# Patient Record
Sex: Male | Born: 1995 | Race: White | Hispanic: No | Marital: Single | Smoking: Light tobacco smoker
Health system: Southern US, Community
[De-identification: ages and names within clinical notes are randomized; demographics above are authoritative.]

## PROBLEM LIST (undated history)

## (undated) DIAGNOSIS — J45909 Unspecified asthma, uncomplicated: Secondary | ICD-10-CM

## (undated) HISTORY — PX: NO PAST SURGERIES: SHX2092

---

## 2016-06-09 ENCOUNTER — Encounter: Payer: Self-pay | Admitting: Emergency Medicine

## 2016-06-09 ENCOUNTER — Emergency Department
Admission: EM | Admit: 2016-06-09 | Discharge: 2016-06-09 | Disposition: A | Discharge status: Home / Self Care | Payer: Commercial Managed Care - PPO | Attending: Emergency Medicine | Admitting: Emergency Medicine

## 2016-06-09 ENCOUNTER — Emergency Department: Payer: Commercial Managed Care - PPO

## 2016-06-09 DIAGNOSIS — S6991XA Unspecified injury of right wrist, hand and finger(s), initial encounter: Secondary | ICD-10-CM | POA: Diagnosis present

## 2016-06-09 DIAGNOSIS — S0993XA Unspecified injury of face, initial encounter: Secondary | ICD-10-CM | POA: Insufficient documentation

## 2016-06-09 DIAGNOSIS — W0110XA Fall on same level from slipping, tripping and stumbling with subsequent striking against unspecified object, initial encounter: Secondary | ICD-10-CM | POA: Insufficient documentation

## 2016-06-09 DIAGNOSIS — Y929 Unspecified place or not applicable: Secondary | ICD-10-CM | POA: Insufficient documentation

## 2016-06-09 DIAGNOSIS — S52571A Other intraarticular fracture of lower end of right radius, initial encounter for closed fracture: Secondary | ICD-10-CM | POA: Insufficient documentation

## 2016-06-09 DIAGNOSIS — Y9389 Activity, other specified: Secondary | ICD-10-CM | POA: Insufficient documentation

## 2016-06-09 DIAGNOSIS — S52611G Displaced fracture of right ulna styloid process, subsequent encounter for closed fracture with delayed healing: Secondary | ICD-10-CM

## 2016-06-09 DIAGNOSIS — Y999 Unspecified external cause status: Secondary | ICD-10-CM | POA: Diagnosis not present

## 2016-06-09 DIAGNOSIS — S52501A Unspecified fracture of the lower end of right radius, initial encounter for closed fracture: Secondary | ICD-10-CM

## 2016-06-09 DIAGNOSIS — S52201G Unspecified fracture of shaft of right ulna, subsequent encounter for closed fracture with delayed healing: Secondary | ICD-10-CM | POA: Insufficient documentation

## 2016-06-09 MED ORDER — OXYCODONE-ACETAMINOPHEN 5-325 MG PO TABS
1.0000 | ORAL_TABLET | Freq: Once | ORAL | Status: AC
  Administered 2016-06-09: 1 via ORAL
  Filled 2016-06-09: qty 1

## 2016-06-09 MED ORDER — OXYCODONE-ACETAMINOPHEN 5-325 MG PO TABS: 1.0000 | ORAL_TABLET | ORAL | 0 refills | Status: AC | PRN

## 2016-06-09 NOTE — Discharge Instructions (Signed)
Call Dr. Neomia GlassMenz's office on Monday morning to schedule a follow up appointment. Take your medications only as directed. Call and schedule an appointment with the dentist of your choice. Return to the ER for symptoms that change or worsen if unable to schedule an appointment at the student health clinic, dentist, or orthopedics.

## 2016-06-09 NOTE — ED Notes (Addendum)
Pt presents with wrist and dental pain related to attempted back flip off of a water slide.  Pt landed on his wrist and also hit face.  No LOC reported.  Sts his tooth "moved" but is not missing.  Pain 7/10.  Wrist pain has diminished with ice pack in place.

## 2016-06-09 NOTE — ED Provider Notes (Signed)
[facility] Emergency Department Provider Note ____________________________________________  Time seen: Approximately 5:54 PM  I have reviewed the triage vital signs and the nursing notes.   HISTORY  Chief Complaint Wrist Pain and Dental Pain    HPI Willie Moreno is a 20 y.o. male who presents to the emergency department for evaluation of right wrist pain. He states he did a back flip off of a slide and landed on his right wrist and hit his face causing a tooth to move. He denies neck pain or loss of consciousness. He is right hand dominant. He has not taken anything for pain since the incident.   History reviewed. No pertinent past medical history.  There are no active problems to display for this patient.   History reviewed. No pertinent surgical history.  Prior to Admission medications   Medication Sig Start Date End Date Taking? Authorizing Provider  oxyCODONE-acetaminophen (ROXICET) 5-325 MG tablet Take 1 tablet by mouth every 4 (four) hours as needed for severe pain. 06/09/16   Chinita Pester, FNP    Allergies Review of patient's allergies indicates no known allergies.  No family history on file.  Social History Social History  Substance Use Topics  . Smoking status: Never Smoker  . Smokeless tobacco: Not on file  . Alcohol use Not on file    Review of Systems Constitutional: No recent illness. Cardiovascular: Denies chest pain or palpitations. Respiratory: Denies shortness of breath. Musculoskeletal: Pain in mouth and right wrist. Skin: Negative for rash, wound, lesion. Neurological: Negative for focal weakness or numbness.  ____________________________________________   PHYSICAL EXAM:  VITAL SIGNS: ED Triage Vitals  Enc Vitals Group     BP 06/09/16 1713 136/81     Pulse Rate 06/09/16 1713 86     Resp 06/09/16 1713 18     Temp 06/09/16 1713 97.7 F (36.5 C)     Temp Source 06/09/16 1713 Oral     SpO2 06/09/16 1713 98 %      Weight 06/09/16 1717 155 lb (70.3 kg)     Height 06/09/16 1717 5\' 9"  (1.753 m)     Head Circumference --      Peak Flow --      Pain Score 06/09/16 1717 7     Pain Loc --      Pain Edu? --      Excl. in GC? --     Constitutional: Alert and oriented. Well appearing and in no acute distress. Eyes: Conjunctivae are normal. EOMI. Head: Atraumatic. Mouth: Tooth #24 shifted, but remains inside the gum. Neck: No stridor.  Respiratory: Normal respiratory effort.   Musculoskeletal: Obvious deformity of the right wrist.  Neurologic:  Normal speech and language. No gross focal neurologic deficits are appreciated. Speech is normal. No gait instability. Motor and sensory intact of the right wrist, hand, and fingers.   Skin:  Skin is warm, dry and intact. Atraumatic. Psychiatric: Mood and affect are normal. Speech and behavior are normal.  ____________________________________________   LABS (all labs ordered are listed, but only abnormal results are displayed)  Labs Reviewed - No data to display ____________________________________________  RADIOLOGY Right wrist:  1. Comminuted impacted intra-articular right distal radius fracture  as described.  2. Comminuted mildly displaced right ulnar styloid fracture.    ____________________________________________   PROCEDURES  Procedure(s) performed: SPLINT APPLICATION  Authorized by: Kem Boroughs Consent: Verbal consent obtained. Risks and benefits: risks, benefits and alternatives were discussed Consent given by: patient Splint applied by: ER  Tech Location details: right wrist Splint type: sugartong Supplies used: ocl and ace Post-procedure: The splinted body part was neurovascularly unchanged following the procedure. Patient tolerance: Patient tolerated the procedure well with no immediate complications. __________________________________________   INITIAL IMPRESSION / ASSESSMENT AND PLAN / ED COURSE  Clinical Course     Pertinent labs & imaging results that were available during my care of the patient were reviewed by me and considered in my medical decision making (see chart for details).  Spoke with Dr. Rosita KeaMenz. He reviewed the x-ray and will follow up with the patient in the office. Percocet (#12) prescribed. He is to return to the ER for symptoms that worsen if unable to see Dr. Rosita KeaMenz. He is also to follow up with the dentist of his choice. ____________________________________________   FINAL CLINICAL IMPRESSION(S) / ED DIAGNOSES  Final diagnoses:  Distal radius fracture, right, closed, initial encounter  Closed displaced fracture of ulnar styloid with delayed healing, right  Dental injury, initial encounter       Chinita PesterCari B Braxston Quinter, FNP 06/09/16 2113    Emily FilbertJonathan E Williams, MD 06/09/16 2314

## 2016-06-09 NOTE — ED Triage Notes (Signed)
Fell on slip and slide approx 1 hour ago, pain R wrist.

## 2016-06-14 ENCOUNTER — Encounter: Payer: Self-pay | Admitting: *Deleted

## 2016-06-14 ENCOUNTER — Ambulatory Visit
Admission: RE | Admit: 2016-06-14 | Discharge: 2016-06-14 | Disposition: A | Discharge status: Home / Self Care | Payer: Commercial Managed Care - PPO | Source: Ambulatory Visit | Attending: Orthopedic Surgery | Admitting: Orthopedic Surgery

## 2016-06-14 ENCOUNTER — Encounter
Admission: RE | Disposition: A | Discharge status: Home / Self Care | Payer: Self-pay | Source: Ambulatory Visit | Attending: Orthopedic Surgery

## 2016-06-14 ENCOUNTER — Ambulatory Visit: Payer: Commercial Managed Care - PPO

## 2016-06-14 ENCOUNTER — Ambulatory Visit: Payer: Commercial Managed Care - PPO | Admitting: Anesthesiology

## 2016-06-14 DIAGNOSIS — J45909 Unspecified asthma, uncomplicated: Secondary | ICD-10-CM | POA: Insufficient documentation

## 2016-06-14 DIAGNOSIS — Z79899 Other long term (current) drug therapy: Secondary | ICD-10-CM | POA: Insufficient documentation

## 2016-06-14 DIAGNOSIS — W19XXXA Unspecified fall, initial encounter: Secondary | ICD-10-CM | POA: Diagnosis not present

## 2016-06-14 DIAGNOSIS — G5601 Carpal tunnel syndrome, right upper limb: Secondary | ICD-10-CM | POA: Diagnosis not present

## 2016-06-14 DIAGNOSIS — F17200 Nicotine dependence, unspecified, uncomplicated: Secondary | ICD-10-CM | POA: Insufficient documentation

## 2016-06-14 DIAGNOSIS — Z8249 Family history of ischemic heart disease and other diseases of the circulatory system: Secondary | ICD-10-CM | POA: Diagnosis not present

## 2016-06-14 DIAGNOSIS — S52501A Unspecified fracture of the lower end of right radius, initial encounter for closed fracture: Secondary | ICD-10-CM | POA: Diagnosis not present

## 2016-06-14 DIAGNOSIS — Z8781 Personal history of (healed) traumatic fracture: Secondary | ICD-10-CM

## 2016-06-14 DIAGNOSIS — Y9379 Activity, other specified sports and athletics: Secondary | ICD-10-CM | POA: Diagnosis not present

## 2016-06-14 DIAGNOSIS — Z9889 Other specified postprocedural states: Secondary | ICD-10-CM

## 2016-06-14 HISTORY — PX: OPEN REDUCTION INTERNAL FIXATION (ORIF) DISTAL RADIAL FRACTURE: SHX5989

## 2016-06-14 HISTORY — PX: CARPAL TUNNEL RELEASE: SHX101

## 2016-06-14 HISTORY — DX: Unspecified asthma, uncomplicated: J45.909

## 2016-06-14 LAB — URINE DRUG SCREEN, QUALITATIVE (ARMC ONLY)
AMPHETAMINES, UR SCREEN: NOT DETECTED
Barbiturates, Ur Screen: NOT DETECTED
Benzodiazepine, Ur Scrn: NOT DETECTED
CANNABINOID 50 NG, UR ~~LOC~~: POSITIVE — AB
COCAINE METABOLITE, UR ~~LOC~~: NOT DETECTED
MDMA (ECSTASY) UR SCREEN: NOT DETECTED
Methadone Scn, Ur: NOT DETECTED
OPIATE, UR SCREEN: NOT DETECTED
PHENCYCLIDINE (PCP) UR S: NOT DETECTED
Tricyclic, Ur Screen: NOT DETECTED

## 2016-06-14 SURGERY — OPEN REDUCTION INTERNAL FIXATION (ORIF) DISTAL RADIUS FRACTURE
Anesthesia: General | Site: Wrist | Laterality: Right | Wound class: Clean

## 2016-06-14 MED ORDER — HYDROCODONE-ACETAMINOPHEN 5-325 MG PO TABS: 1.0000 | ORAL_TABLET | Freq: Four times a day (QID) | ORAL | 0 refills | Status: AC | PRN

## 2016-06-14 MED ORDER — FENTANYL CITRATE (PF) 100 MCG/2ML IJ SOLN
25.0000 ug | INTRAMUSCULAR | Status: AC | PRN
  Administered 2016-06-14 (×6): 25 ug via INTRAVENOUS

## 2016-06-14 MED ORDER — ONDANSETRON HCL 4 MG/2ML IJ SOLN
INTRAMUSCULAR | Status: DC | PRN
  Administered 2016-06-14: 4 mg via INTRAVENOUS

## 2016-06-14 MED ORDER — FENTANYL CITRATE (PF) 100 MCG/2ML IJ SOLN
INTRAMUSCULAR | Status: AC
  Administered 2016-06-14: 25 ug via INTRAVENOUS
  Filled 2016-06-14: qty 2

## 2016-06-14 MED ORDER — HYDROMORPHONE HCL 1 MG/ML IJ SOLN
0.2500 mg | INTRAMUSCULAR | Status: DC | PRN
  Administered 2016-06-14: 0.25 mg via INTRAVENOUS

## 2016-06-14 MED ORDER — BUPIVACAINE HCL 0.5 % IJ SOLN
INTRAMUSCULAR | Status: DC | PRN
  Administered 2016-06-14: 10 mL

## 2016-06-14 MED ORDER — ONDANSETRON HCL 4 MG/2ML IJ SOLN: 4.0000 mg | Freq: Once | INTRAMUSCULAR | Status: DC | PRN

## 2016-06-14 MED ORDER — BUPIVACAINE HCL (PF) 0.5 % IJ SOLN
INTRAMUSCULAR | Status: AC
  Filled 2016-06-14: qty 30

## 2016-06-14 MED ORDER — CEFAZOLIN SODIUM-DEXTROSE 2-4 GM/100ML-% IV SOLN
INTRAVENOUS | Status: AC
  Filled 2016-06-14: qty 100

## 2016-06-14 MED ORDER — SODIUM CHLORIDE 0.9 % IV SOLN: INTRAVENOUS | Status: DC

## 2016-06-14 MED ORDER — METOCLOPRAMIDE HCL 5 MG/ML IJ SOLN: 5.0000 mg | Freq: Three times a day (TID) | INTRAMUSCULAR | Status: DC | PRN

## 2016-06-14 MED ORDER — ONDANSETRON HCL 4 MG/2ML IJ SOLN: 4.0000 mg | Freq: Four times a day (QID) | INTRAMUSCULAR | Status: DC | PRN

## 2016-06-14 MED ORDER — ONDANSETRON HCL 4 MG PO TABS: 4.0000 mg | ORAL_TABLET | Freq: Four times a day (QID) | ORAL | Status: DC | PRN

## 2016-06-14 MED ORDER — CEFAZOLIN SODIUM-DEXTROSE 2-4 GM/100ML-% IV SOLN
2.0000 g | Freq: Once | INTRAVENOUS | Status: AC
  Administered 2016-06-14: 2 g via INTRAVENOUS

## 2016-06-14 MED ORDER — DEXAMETHASONE SODIUM PHOSPHATE 10 MG/ML IJ SOLN
INTRAMUSCULAR | Status: DC | PRN
  Administered 2016-06-14: 10 mg via INTRAVENOUS

## 2016-06-14 MED ORDER — HYDROCODONE-ACETAMINOPHEN 5-325 MG PO TABS
ORAL_TABLET | ORAL | Status: AC
  Filled 2016-06-14: qty 2

## 2016-06-14 MED ORDER — LACTATED RINGERS IV SOLN
INTRAVENOUS | Status: DC
  Administered 2016-06-14: 13:00:00 via INTRAVENOUS

## 2016-06-14 MED ORDER — HYDROCODONE-ACETAMINOPHEN 5-325 MG PO TABS
1.0000 | ORAL_TABLET | ORAL | Status: DC | PRN
  Administered 2016-06-14: 2 via ORAL

## 2016-06-14 MED ORDER — PROPOFOL 10 MG/ML IV BOLUS
INTRAVENOUS | Status: DC | PRN
  Administered 2016-06-14: 200 mg via INTRAVENOUS

## 2016-06-14 MED ORDER — METOCLOPRAMIDE HCL 10 MG PO TABS: 5.0000 mg | ORAL_TABLET | Freq: Three times a day (TID) | ORAL | Status: DC | PRN

## 2016-06-14 MED ORDER — NEOMYCIN-POLYMYXIN B GU 40-200000 IR SOLN
Status: DC | PRN
  Administered 2016-06-14: 2 mL

## 2016-06-14 MED ORDER — FENTANYL CITRATE (PF) 100 MCG/2ML IJ SOLN
INTRAMUSCULAR | Status: DC | PRN
  Administered 2016-06-14 (×2): 50 ug via INTRAVENOUS

## 2016-06-14 MED ORDER — HYDROMORPHONE HCL 1 MG/ML IJ SOLN
INTRAMUSCULAR | Status: AC
  Administered 2016-06-14: 0.25 mg via INTRAVENOUS
  Filled 2016-06-14: qty 1

## 2016-06-14 SURGICAL SUPPLY — 49 items
BANDAGE ACE 3X5.8 VEL STRL LF (GAUZE/BANDAGES/DRESSINGS) ×3 IMPLANT
BANDAGE ACE 4X5 VEL STRL LF (GAUZE/BANDAGES/DRESSINGS) ×3 IMPLANT
BIT DRILL 2 FAST STEP (BIT) ×3 IMPLANT
BIT DRILL 2.5X4 QC (BIT) ×3 IMPLANT
BNDG ESMARK 4X12 TAN STRL LF (GAUZE/BANDAGES/DRESSINGS) ×3 IMPLANT
CANISTER SUCT 1200ML W/VALVE (MISCELLANEOUS) ×3 IMPLANT
CHLORAPREP W/TINT 26ML (MISCELLANEOUS) ×3 IMPLANT
CUFF TOURN 18 STER (MISCELLANEOUS) ×3 IMPLANT
DRAPE FLUOR MINI C-ARM 54X84 (DRAPES) ×3 IMPLANT
ELECT CAUTERY NEEDLE 2.0 MIC (NEEDLE) IMPLANT
ELECT CAUTERY NEEDLE TIP 1.0 (MISCELLANEOUS)
ELECT REM PT RETURN 9FT ADLT (ELECTROSURGICAL) ×3
ELECTRODE CAUTERY NEDL TIP 1.0 (MISCELLANEOUS) IMPLANT
ELECTRODE REM PT RTRN 9FT ADLT (ELECTROSURGICAL) ×1 IMPLANT
GAUZE PETRO XEROFOAM 1X8 (MISCELLANEOUS) ×6 IMPLANT
GAUZE SPONGE 4X4 12PLY STRL (GAUZE/BANDAGES/DRESSINGS) ×3 IMPLANT
GLOVE BIOGEL PI IND STRL 9 (GLOVE) ×1 IMPLANT
GLOVE BIOGEL PI INDICATOR 9 (GLOVE) ×2
GLOVE SURG SYN 9.0  PF PI (GLOVE) ×2
GLOVE SURG SYN 9.0 PF PI (GLOVE) ×1 IMPLANT
GOWN SRG 2XL LVL 4 RGLN SLV (GOWNS) ×1 IMPLANT
GOWN STRL NON-REIN 2XL LVL4 (GOWNS) ×2
GOWN STRL REUS W/ TWL LRG LVL3 (GOWN DISPOSABLE) ×1 IMPLANT
GOWN STRL REUS W/TWL 2XL LVL3 (GOWN DISPOSABLE) ×3 IMPLANT
GOWN STRL REUS W/TWL LRG LVL3 (GOWN DISPOSABLE) ×2
K-WIRE 1.6 (WIRE) ×4
K-WIRE FX5X1.6XNS BN SS (WIRE) ×2
KIT RM TURNOVER STRD PROC AR (KITS) ×3 IMPLANT
KWIRE FX5X1.6XNS BN SS (WIRE) ×2 IMPLANT
NEEDLE FILTER BLUNT 18X 1/2SAF (NEEDLE) ×2
NEEDLE FILTER BLUNT 18X1 1/2 (NEEDLE) ×1 IMPLANT
NS IRRIG 500ML POUR BTL (IV SOLUTION) ×3 IMPLANT
PACK EXTREMITY ARMC (MISCELLANEOUS) ×3 IMPLANT
PAD CAST CTTN 4X4 STRL (SOFTGOODS) ×2 IMPLANT
PADDING CAST COTTON 4X4 STRL (SOFTGOODS) ×4
PEG SUBCHONDRAL SMOOTH 2.0X14 (Peg) ×3 IMPLANT
PEG SUBCHONDRAL SMOOTH 2.0X20 (Peg) ×6 IMPLANT
PEG SUBCHONDRAL SMOOTH 2.0X22 (Peg) ×9 IMPLANT
PLATE SHORT 21.6X48.9 NRRW RT (Plate) ×3 IMPLANT
SCREW BN 12X3.5XNS CORT TI (Screw) ×1 IMPLANT
SCREW CORT 3.5X12 (Screw) ×2 IMPLANT
SCREW CORT 3.5X14 LNG (Screw) ×3 IMPLANT
SPLINT CAST 1 STEP 3X12 (MISCELLANEOUS) ×3 IMPLANT
STOCKINETTE STRL 4IN 9604848 (GAUZE/BANDAGES/DRESSINGS) ×3 IMPLANT
SUT ETHILON 4-0 (SUTURE) ×2
SUT ETHILON 4-0 FS2 18XMFL BLK (SUTURE) ×1
SUT VICRYL 3-0 27IN (SUTURE) ×3 IMPLANT
SUTURE ETHLN 4-0 FS2 18XMF BLK (SUTURE) ×1 IMPLANT
SYR 3ML LL SCALE MARK (SYRINGE) ×3 IMPLANT

## 2016-06-14 NOTE — Anesthesia Procedure Notes (Signed)
Procedure Name: LMA Insertion Date/Time: 06/14/2016 1:55 PM Performed by: Willie Moreno, Willie Virgin Pre-anesthesia Checklist: Patient identified, Patient being monitored, Timeout performed, Emergency Drugs available and Suction available Patient Re-evaluated:Patient Re-evaluated prior to inductionOxygen Delivery Method: Circle system utilized Preoxygenation: Pre-oxygenation with 100% oxygen Intubation Type: IV induction Ventilation: Mask ventilation without difficulty LMA: LMA inserted LMA Size: 4.0 Tube type: Oral Number of attempts: 1 Placement Confirmation: positive ETCO2 and breath sounds checked- equal and bilateral Tube secured with: Tape Dental Injury: Teeth and Oropharynx as per pre-operative assessment

## 2016-06-14 NOTE — Discharge Instructions (Addendum)
AMBULATORY SURGERY  DISCHARGE INSTRUCTIONS   1) The drugs that you were given will stay in your system until tomorrow so for the next 24 hours you should not:  A) Drive an automobile B) Make any legal decisions C) Drink any alcoholic beverage   2) You may resume regular meals tomorrow.  Today it is better to start with liquids and gradually work up to solid foods.  You may eat anything you prefer, but it is better to start with liquids, then soup and crackers, and gradually work up to solid foods.   3) Please notify your doctor immediately if you have any unusual bleeding, trouble breathing, redness and pain at the surgery site, drainage, fever, or pain not relieved by medication.   4) Additional Instructions: Keep arm elevated is much as possible. Okay to return to school tomorrow if feeling comfortable enough. Ice to back of wrist tonight. Work fingers is much as possible. Keep splint clean and dry. If fingers swell a great deal it's okay to loosen the Ace wrap but leave the splint and other wrap in place. Call the office severe having any problems tomorrow

## 2016-06-14 NOTE — H&P (Signed)
Reviewed paper H+P, will be scanned into chart. No changes noted.  

## 2016-06-14 NOTE — Addendum Note (Signed)
Addendum  created 06/14/16 1859 by Malva Coganatherine Aisley Whan, CRNA   Anesthesia Attestations filed

## 2016-06-14 NOTE — Anesthesia Postprocedure Evaluation (Signed)
Anesthesia Post Note  Patient: Willie LeveringLogan Moreno  Procedure(s) Performed: Procedure(s) (LRB): OPEN REDUCTION INTERNAL FIXATION (ORIF) DISTAL RADIAL FRACTURE (Right) CARPAL TUNNEL RELEASE (Right)  Patient location during evaluation: PACU Anesthesia Type: General Level of consciousness: awake and alert Pain management: pain level controlled Vital Signs Assessment: post-procedure vital signs reviewed and stable Respiratory status: spontaneous breathing and respiratory function stable Cardiovascular status: stable Anesthetic complications: no    Last Vitals:  Vitals:   06/14/16 1227 06/14/16 1505  BP: 134/61 (!) 155/94  Pulse: 68 90  Resp: 14 19  Temp: (!) 35.9 C 36.3 C    Last Pain:  Vitals:   06/14/16 1227  TempSrc: Tympanic                 Ibraham Levi K

## 2016-06-14 NOTE — Anesthesia Preprocedure Evaluation (Signed)
Anesthesia Evaluation  Patient identified by MRN, date of birth, ID band Patient awake    Reviewed: Allergy & Precautions, NPO status , Patient's Chart, lab work & pertinent test results  History of Anesthesia Complications Negative for: history of anesthetic complications  Airway Mallampati: II       Dental   Pulmonary asthma , Current Smoker,           Cardiovascular negative cardio ROS       Neuro/Psych negative neurological ROS     GI/Hepatic negative GI ROS, Neg liver ROS, (+)     substance abuse  cocaine use and marijuana use,   Endo/Other  negative endocrine ROS  Renal/GU negative Renal ROS     Musculoskeletal   Abdominal   Peds  Hematology negative hematology ROS (+)   Anesthesia Other Findings   Reproductive/Obstetrics                             Anesthesia Physical Anesthesia Plan  ASA: II  Anesthesia Plan: General   Post-op Pain Management:    Induction:   Airway Management Planned: LMA  Additional Equipment:   Intra-op Plan:   Post-operative Plan:   Informed Consent: I have reviewed the patients History and Physical, chart, labs and discussed the procedure including the risks, benefits and alternatives for the proposed anesthesia with the patient or authorized representative who has indicated his/her understanding and acceptance.     Plan Discussed with:   Anesthesia Plan Comments:         Anesthesia Quick Evaluation

## 2016-06-14 NOTE — Op Note (Signed)
06/14/2016  3:00 PM  PATIENT:  Willie LeveringLogan Moreno  [age] y.o. male  PRE-OPERATIVE DIAGNOSIS:  displaced fracture of distal end of radius, carpal tunnel syndrome of right wrist  POST-OPERATIVE DIAGNOSIS:  displaced fracture of distal end of radius, carpal tunnel syndrome of right wrist  PROCEDURE:  Procedure(s): OPEN REDUCTION INTERNAL FIXATION (ORIF) DISTAL RADIAL FRACTURE (Right) CARPAL TUNNEL RELEASE (Right)  SURGEON: Leitha SchullerMichael J Christelle Igoe, MD  ASSISTANTS: none  ANESTHESIA:   general  EBL:  Total I/O In: 800 [I.V.:800] Out: 5 [Blood:5]  BLOOD ADMINISTERED:none  DRAINS: none   LOCAL MEDICATIONS USED:  MARCAINE     SPECIMEN:  No Specimen  DISPOSITION OF SPECIMEN:  N/A  COUNTS:  YES  TOURNIQUET:  34 min at 250 mm Hg  IMPLANTS: hand innovations DVR PLATE WITH SCREWS AND PEGS  DICTATION: .Dragon Dictation patient brought the operating room and after adequate anesthesia was obtained the right arm was prepped and draped in sterile fashion with a tourniquet applied to the upper arm. After patient identification and timeout procedures tourniquet was raised and volar incision was made over the FCR tendon. He has to tendon sheath was incised and the tendon retracted radially. The deep fascia was incised and the muscle was elevated off the distal fragment and proximal fragment with exposure of the fracture site. Traction was then applied through fingertrap traction to get length was a spike of bone volar which is quite large and after traction was applied this could be reduced back to the proximal fragment but there still was some residual dorsal angulation of the distal fragment. With this in mind a short narrow DVR plate was applied to the distal fragment and pinned with K wires following this the slotted screw hole was filled in the shaft with a 12 mm screw after drilling this brought the plate down to the shaft and gave restoration of volar tilt length appeared appropriate and appeared essentially  anatomic alignment was obtained. These a second screw was placed in the shaft with a 14 mm screws seemed to give stable fixation of the plate so the last screw hole was left alone. Next the peg holes of the distal plate were filled drilling measuring and placing the smooth pegs. Oblique views of the wrist were taken to make sure there is no penetration into the joint and there was none. After the peg holes were filled the traction was released and under fluoroscopy there is no gross motion of the fracture. The carpal tunnel was then approached through the palm with a proximally 1 inch incision in line with ring metacarpal. Subcutaneous tissues tissue spread and the transverse carpal ligament identified with a small incision made the hemostat was placed underneath the transverse carpal ligament release care distally and then proximally there is no obvious compression on the nerve no masses within the carpal tunnel. After release proximal distal such that there did not appear to be any compression on the nerve the wound was irrigated and local anesthetic 10 cc infiltrated in the area of the carpal tunnel incision. The wounds were then closed with M the tourniquet down 3-0 Vicryl for the distal radius incision followed by 4-0 nylon in a simple interrupted fashion with 4-0 nylon in a simple operative fashion for the carpal tunnel release Xeroform 4 x 4 web roll and a volar splint were applied and the patient tolerated procedure well with center comes stable condition Patient was brought the operating room PLAN OF CARE: Discharge to home after PACU  PATIENT  DISPOSITION:  PACU - hemodynamically stable.

## 2016-06-14 NOTE — Transfer of Care (Signed)
Immediate Anesthesia Transfer of Care Note  Patient: Willie LeveringLogan Moreno  Procedure(s) Performed: Procedure(s): OPEN REDUCTION INTERNAL FIXATION (ORIF) DISTAL RADIAL FRACTURE (Right) CARPAL TUNNEL RELEASE (Right)  Patient Location: PACU  Anesthesia Type:General  Level of Consciousness: awake  Airway & Oxygen Therapy: Patient Spontanous Breathing and Patient connected to face mask oxygen  Post-op Assessment: Report given to RN, Post op   Post vital signs: Reviewed and stable  Last Vitals:  Vitals:   06/14/16 1227  BP: 134/61  Pulse: 68  Resp: 14  Temp: (!) 35.9 C    Last Pain:  Vitals:   06/14/16 1227  TempSrc: Tympanic         Complications: No apparent anesthesia complications

## 2016-06-15 ENCOUNTER — Encounter: Payer: Self-pay | Admitting: Orthopedic Surgery

## 2017-10-07 ENCOUNTER — Other Ambulatory Visit: Payer: Self-pay

## 2017-10-07 ENCOUNTER — Emergency Department: Payer: Commercial Managed Care - PPO

## 2017-10-07 ENCOUNTER — Encounter: Payer: Self-pay | Admitting: Emergency Medicine

## 2017-10-07 ENCOUNTER — Emergency Department
Admission: EM | Admit: 2017-10-07 | Discharge: 2017-10-07 | Disposition: A | Discharge status: Home / Self Care | Payer: Commercial Managed Care - PPO | Attending: Student in an Organized Health Care Education/Training Program | Admitting: Student in an Organized Health Care Education/Training Program

## 2017-10-07 DIAGNOSIS — Y999 Unspecified external cause status: Secondary | ICD-10-CM | POA: Insufficient documentation

## 2017-10-07 DIAGNOSIS — S59901A Unspecified injury of right elbow, initial encounter: Secondary | ICD-10-CM | POA: Diagnosis present

## 2017-10-07 DIAGNOSIS — F17200 Nicotine dependence, unspecified, uncomplicated: Secondary | ICD-10-CM | POA: Diagnosis not present

## 2017-10-07 DIAGNOSIS — L03113 Cellulitis of right upper limb: Secondary | ICD-10-CM | POA: Diagnosis not present

## 2017-10-07 DIAGNOSIS — S5001XA Contusion of right elbow, initial encounter: Secondary | ICD-10-CM | POA: Diagnosis not present

## 2017-10-07 DIAGNOSIS — Y929 Unspecified place or not applicable: Secondary | ICD-10-CM | POA: Diagnosis not present

## 2017-10-07 DIAGNOSIS — L089 Local infection of the skin and subcutaneous tissue, unspecified: Secondary | ICD-10-CM

## 2017-10-07 DIAGNOSIS — W0110XA Fall on same level from slipping, tripping and stumbling with subsequent striking against unspecified object, initial encounter: Secondary | ICD-10-CM | POA: Insufficient documentation

## 2017-10-07 DIAGNOSIS — J45909 Unspecified asthma, uncomplicated: Secondary | ICD-10-CM | POA: Diagnosis not present

## 2017-10-07 DIAGNOSIS — T148XXA Other injury of unspecified body region, initial encounter: Secondary | ICD-10-CM

## 2017-10-07 DIAGNOSIS — Y9351 Activity, roller skating (inline) and skateboarding: Secondary | ICD-10-CM | POA: Insufficient documentation

## 2017-10-07 LAB — CBC WITH DIFFERENTIAL/PLATELET
BASOS ABS: 0 10*3/uL (ref 0–0.1)
BASOS PCT: 0 %
EOS PCT: 0 %
Eosinophils Absolute: 0.1 10*3/uL (ref 0–0.7)
HCT: 44.1 % (ref 40.0–52.0)
Hemoglobin: 14.8 g/dL (ref 13.0–18.0)
LYMPHS PCT: 19 %
Lymphs Abs: 2.3 10*3/uL (ref 1.0–3.6)
MCH: 29.4 pg (ref 26.0–34.0)
MCHC: 33.7 g/dL (ref 32.0–36.0)
MCV: 87.3 fL (ref 80.0–100.0)
MONO ABS: 1 10*3/uL (ref 0.2–1.0)
Monocytes Relative: 8 %
NEUTROS ABS: 9.2 10*3/uL — AB (ref 1.4–6.5)
Neutrophils Relative %: 73 %
PLATELETS: 226 10*3/uL (ref 150–440)
RBC: 5.05 MIL/uL (ref 4.40–5.90)
RDW: 13.7 % (ref 11.5–14.5)
WBC: 12.7 10*3/uL — AB (ref 3.8–10.6)

## 2017-10-07 MED ORDER — CLINDAMYCIN PHOSPHATE 600 MG/50ML IV SOLN
600.0000 mg | Freq: Once | INTRAVENOUS | Status: AC
  Administered 2017-10-07: 600 mg via INTRAVENOUS
  Filled 2017-10-07: qty 50

## 2017-10-07 MED ORDER — CLINDAMYCIN HCL 300 MG PO CAPS: 300.0000 mg | ORAL_CAPSULE | Freq: Four times a day (QID) | ORAL | 0 refills | Status: AC

## 2017-10-07 NOTE — Discharge Instructions (Signed)
You have sustained an infected wound and cellulitis to the right elbow.

## 2017-10-07 NOTE — ED Notes (Signed)
See triage note  States he fell on Saturday  Landed on right elbow  Abrasion noted to elbow with swelling  Positive ROM  Good pulses

## 2017-10-07 NOTE — ED Triage Notes (Signed)
Pt to ed with c/o right elbow pain since falling on Saturday night.  Pt states he was rollerblading on Saturday.  Reports increased swelling and pain since.

## 2017-10-07 NOTE — ED Provider Notes (Signed)
[facility] Emergency Department Provider Note ____________________________________________  Time seen: 1214  I have reviewed the triage vital signs and the nursing notes.  HISTORY  Chief Complaint  Elbow Pain  HPI Willie Moreno is a 22 y.o. male presents himself to the ED for evaluation of right elbow pain following a mechanical fall.  She describes rollerblading outside, on Saturday evening, when he took a fall.  He landed on his right elbow and since that time has had increasing pain, swelling, redness.  He presents now with an open wound to the posterior elbow and swelling to the upper arm and forearm.  He denies any particular pain to the joint, but notes continued swelling and redness as well as a draining wound.  He denies any interim fevers, chills, or sweats.  Past Medical History:  Diagnosis Date  . Asthma     There are no active problems to display for this patient.   Past Surgical History:  Procedure Laterality Date  . CARPAL TUNNEL RELEASE Right 06/14/2016   Procedure: CARPAL TUNNEL RELEASE;  Surgeon: Kennedy Bucker, MD;  Location: [facility] ORS;  Service: Orthopedics;  Laterality: Right;  . NO PAST SURGERIES    . OPEN REDUCTION INTERNAL FIXATION (ORIF) DISTAL RADIAL FRACTURE Right 06/14/2016   Procedure: OPEN REDUCTION INTERNAL FIXATION (ORIF) DISTAL RADIAL FRACTURE;  Surgeon: Kennedy Bucker, MD;  Location: [facility] ORS;  Service: Orthopedics;  Laterality: Right;    Prior to Admission medications   Medication Sig Start Date End Date Taking? Authorizing Provider  clindamycin (CLEOCIN) 300 MG capsule Take 1 capsule (300 mg total) by mouth 4 (four) times daily for 10 days. 10/07/17 10/17/17  Shahidah Nesbitt, Charlesetta Ivory, PA-C  HYDROcodone-acetaminophen (NORCO) 5-325 MG tablet Take 1 tablet by mouth every 6 (six) hours as needed for moderate pain. 06/14/16   Kennedy Bucker, MD  oxyCODONE-acetaminophen (ROXICET) 5-325 MG tablet Take 1 tablet by mouth every 4 (four) hours as  needed for severe pain. 06/09/16   Chinita Pester, FNP    Allergies Patient has no known allergies.  No family history on file.  Social History Social History   Tobacco Use  . Smoking status: Light Tobacco Smoker  . Smokeless tobacco: Never Used  . Tobacco comment: once or twice a month  Substance Use Topics  . Alcohol use: Yes  . Drug use: Yes    Types: Marijuana, Cocaine    Comment: used mj last week and has used cocaine this year    Review of Systems  Constitutional: Negative for fever. Eyes: Negative for visual changes. ENT: Negative for sore throat. Cardiovascular: Negative for chest pain. Respiratory: Negative for shortness of breath. Gastrointestinal: Negative for abdominal pain, vomiting and diarrhea. Genitourinary: Negative for dysuria. Musculoskeletal: Negative for back pain.  Right elbow pain/swelling as above.   Skin: Negative for rash. Neurological: Negative for headaches, focal weakness or numbness. ____________________________________________  PHYSICAL EXAM:  VITAL SIGNS: ED Triage Vitals  Enc Vitals Group     BP 10/07/17 1150 (!) 143/63     Pulse Rate 10/07/17 1150 82     Resp 10/07/17 1150 16     Temp 10/07/17 1150 98.2 F (36.8 C)     Temp Source 10/07/17 1150 Oral     SpO2 10/07/17 1150 98 %     Weight 10/07/17 1151 155 lb (70.3 kg)     Height 10/07/17 1151 5\' 9"  (1.753 m)     Head Circumference --      Peak Flow --  Pain Score 10/07/17 1151 5     Pain Loc --      Pain Edu? --      Excl. in GC? --     Constitutional: Alert and oriented. Well appearing and in no distress. Head: Normocephalic and atraumatic. Hematological/Lymphatic/Immunological: No cervical lymphadenopathy. Cardiovascular: Normal rate, regular rhythm. Normal distal pulses. Respiratory: Normal respiratory effort. No wheezes/rales/rhonchi. Musculoskeletal: Right elbow without obvious deformity, dislocation.  Patient does have moderate soft tissue swelling about the  elbow with extension of overlying erythema to the distal upper arm and distal forearm.  Patient with slightly decreased supination range secondary to soft tissue swelling.  Normal composite fist.  Nontender with normal range of motion in all other extremities.  Neurologic:  Normal gait without ataxia. Normal speech and language. No gross focal neurologic deficits are appreciated. Skin:  Skin is warm, dry and intact. No rash noted.  Right elbow with soft tissue swelling and erythema is appreciated.  Patient with a punctate lesion to the olecranon process.  There is purulent drainage noted spontaneously from the open wound. ____________________________________________   LABS (pertinent positives/negatives)  Labs Reviewed  CBC WITH DIFFERENTIAL/PLATELET - Abnormal; Notable for the following components:      Result Value   WBC 12.7 (*)    Neutro Abs 9.2 (*)    All other components within normal limits  AEROBIC CULTURE (SUPERFICIAL SPECIMEN)  ____________________________________________   RADIOLOGY  Right Elbow  IMPRESSION: Mild soft tissue swelling over the olecranon. No acute osseous Abnormality  I, Ahmia Colford, Charlesetta IvoryJenise V Bacon, personally viewed and evaluated these images (plain radiographs) as part of my medical decision making, as well as reviewing the written report by the radiologist. ____________________________________________  PROCEDURES  Procedures Clindamycin 600 mg IVP ____________________________________________  INITIAL IMPRESSION / ASSESSMENT AND PLAN / ED COURSE  Patient presents to the ED with a right elbow contusion secondary to mechanical fall.  He has sustained an abrasion to the right elbow and now presents with an infected wound and spreading cellulitis.  His x-rays are reassuring as it shows no acute fracture or dislocation.  His vital signs have remained stable in the ED.  He has a very mild early leukocytosis on lab review.  He is discharged after the wound is  appropriately cleansed and flushed and dressed, with wound care instructions. Border of erythema/celluitis is marked for his monitoring.  He will follow-up with his clinic or return to the ED for signs of worsening infection.  He is discharged with a prescription for clindamycin and strict return precautions. ____________________________________________  FINAL CLINICAL IMPRESSION(S) / ED DIAGNOSES  Final diagnoses:  Contusion of right elbow, initial encounter  Cellulitis of right upper extremity  Infected open wound      Gwenivere Hiraldo, Charlesetta IvoryJenise V Bacon, PA-C 10/07/17 1433    Willy Eddyobinson, Patrick, MD 10/07/17 1439

## 2017-10-09 ENCOUNTER — Emergency Department
Admission: EM | Admit: 2017-10-09 | Discharge: 2017-10-10 | Disposition: A | Discharge status: Home / Self Care | Payer: Commercial Managed Care - PPO | Attending: Emergency Medicine | Admitting: Emergency Medicine

## 2017-10-09 DIAGNOSIS — F17200 Nicotine dependence, unspecified, uncomplicated: Secondary | ICD-10-CM | POA: Diagnosis not present

## 2017-10-09 DIAGNOSIS — L03113 Cellulitis of right upper limb: Secondary | ICD-10-CM | POA: Insufficient documentation

## 2017-10-09 DIAGNOSIS — J45909 Unspecified asthma, uncomplicated: Secondary | ICD-10-CM | POA: Diagnosis not present

## 2017-10-09 DIAGNOSIS — R2231 Localized swelling, mass and lump, right upper limb: Secondary | ICD-10-CM | POA: Diagnosis present

## 2017-10-09 LAB — CBC WITH DIFFERENTIAL/PLATELET
Basophils Absolute: 0 10*3/uL (ref 0–0.1)
Basophils Relative: 0 %
Eosinophils Absolute: 0.1 10*3/uL (ref 0–0.7)
Eosinophils Relative: 2 %
HEMATOCRIT: 44.6 % (ref 40.0–52.0)
HEMOGLOBIN: 15.2 g/dL (ref 13.0–18.0)
LYMPHS ABS: 3.5 10*3/uL (ref 1.0–3.6)
Lymphocytes Relative: 39 %
MCH: 29.9 pg (ref 26.0–34.0)
MCHC: 34 g/dL (ref 32.0–36.0)
MCV: 88 fL (ref 80.0–100.0)
MONO ABS: 0.6 10*3/uL (ref 0.2–1.0)
MONOS PCT: 7 %
NEUTROS ABS: 4.8 10*3/uL (ref 1.4–6.5)
NEUTROS PCT: 52 %
Platelets: 305 10*3/uL (ref 150–440)
RBC: 5.06 MIL/uL (ref 4.40–5.90)
RDW: 13.6 % (ref 11.5–14.5)
WBC: 9.1 10*3/uL (ref 3.8–10.6)

## 2017-10-09 LAB — COMPREHENSIVE METABOLIC PANEL
ALK PHOS: 37 U/L — AB (ref 38–126)
ALT: 20 U/L (ref 17–63)
ANION GAP: 11 (ref 5–15)
AST: 21 U/L (ref 15–41)
Albumin: 4.4 g/dL (ref 3.5–5.0)
BILIRUBIN TOTAL: 0.7 mg/dL (ref 0.3–1.2)
BUN: 13 mg/dL (ref 6–20)
CALCIUM: 9.4 mg/dL (ref 8.9–10.3)
CO2: 24 mmol/L (ref 22–32)
Chloride: 103 mmol/L (ref 101–111)
Creatinine, Ser: 1.01 mg/dL (ref 0.61–1.24)
Glucose, Bld: 122 mg/dL — ABNORMAL HIGH (ref 65–99)
Potassium: 3.6 mmol/L (ref 3.5–5.1)
Sodium: 138 mmol/L (ref 135–145)
TOTAL PROTEIN: 8.3 g/dL — AB (ref 6.5–8.1)

## 2017-10-09 MED ORDER — DEXTROSE 5 % IV SOLN
320.0000 mg | Freq: Once | INTRAVENOUS | Status: AC
  Administered 2017-10-10: 320 mg via INTRAVENOUS
  Filled 2017-10-09: qty 20

## 2017-10-09 NOTE — ED Notes (Signed)
R elbow with a wound that was seen last week and treated with PO abx. Today it is worse and redness has increased.

## 2017-10-09 NOTE — ED Triage Notes (Signed)
Patient reports being seen in this ED on Monday for wound on right elbow. Patient reports a wound culture was performed at the time. Patient was diagnosed with cellulitis and open wound. Area of redness was marked - the area of redness/swelling now extends beyond the marked area. Patient has been taking his antibiotics as prescribed. Patient was prescribed clindamycin.

## 2017-10-09 NOTE — ED Provider Notes (Signed)
[facility] Emergency Department Provider Note    First MD Initiated Contact with Patient 10/09/17 2336     (approximate)  I have reviewed the triage vital signs and the nursing notes.   HISTORY  Chief Complaint Wound Check    HPI Willie Moreno is a 22 y.o. male returns to the emergency department with worsening right elbow erythema and swelling after being evaluated on 10/07/2017 and diagnosed with cellulitis of the right elbow following accidental fall while rollerblading with overlying abrasion.  Patient was prescribed clindamycin and states that he has been compliant with his such however despite his compliance area of erythema is worsened as well as swelling.  Patient denies any fever afebrile on presentation temperature 98.1   Past Medical History:  Diagnosis Date  . Asthma     There are no active problems to display for this patient.   Past Surgical History:  Procedure Laterality Date  . CARPAL TUNNEL RELEASE Right 06/14/2016   Procedure: CARPAL TUNNEL RELEASE;  Surgeon: Kennedy Bucker, MD;  Location: [facility] ORS;  Service: Orthopedics;  Laterality: Right;  . NO PAST SURGERIES    . OPEN REDUCTION INTERNAL FIXATION (ORIF) DISTAL RADIAL FRACTURE Right 06/14/2016   Procedure: OPEN REDUCTION INTERNAL FIXATION (ORIF) DISTAL RADIAL FRACTURE;  Surgeon: Kennedy Bucker, MD;  Location: [facility] ORS;  Service: Orthopedics;  Laterality: Right;    Prior to Admission medications   Medication Sig Start Date End Date Taking? Authorizing Provider  clindamycin (CLEOCIN) 300 MG capsule Take 1 capsule (300 mg total) by mouth 4 (four) times daily for 10 days. 10/07/17 10/17/17  Menshew, Charlesetta Ivory, PA-C  HYDROcodone-acetaminophen (NORCO) 5-325 MG tablet Take 1 tablet by mouth every 6 (six) hours as needed for moderate pain. 06/14/16   Kennedy Bucker, MD  oxyCODONE-acetaminophen (ROXICET) 5-325 MG tablet Take 1 tablet by mouth every 4 (four) hours as needed for severe pain.  06/09/16   Chinita Pester, FNP    Allergies Patient has no known allergies.  No family history on file.  Social History Social History   Tobacco Use  . Smoking status: Light Tobacco Smoker  . Smokeless tobacco: Never Used  . Tobacco comment: once or twice a month  Substance Use Topics  . Alcohol use: Yes  . 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 ENT: No sore throat. Cardiovascular: Denies chest pain. Respiratory: Denies shortness of breath. Gastrointestinal: No abdominal pain.  No nausea, no vomiting.  No diarrhea.  No constipation. Genitourinary: Negative for dysuria. Musculoskeletal: Negative for neck pain.  Negative for back pain. Integumentary: Positive for right elbow/forearm redness and swelling Neurological: Negative for headaches, focal weakness or numbness.   ____________________________________________   PHYSICAL EXAM:  VITAL SIGNS: ED Triage Vitals  Enc Vitals Group     BP 10/09/17 2029 140/70     Pulse Rate 10/09/17 2029 87     Resp 10/09/17 2029 17     Temp 10/09/17 2029 98.1 F (36.7 C)     Temp Source 10/09/17 2029 Oral     SpO2 10/09/17 2029 100 %     Weight 10/09/17 2031 70.3 kg (155 lb)     Height --      Head Circumference --      Peak Flow --      Pain Score 10/09/17 2031 0  Pain Loc --      Pain Edu? --      Excl. in GC? --     Constitutional: Alert and oriented. Well appearing and in no acute distress. Eyes: Conjunctivae are normal.  Head: Atraumatic. Mouth/Throat: Mucous membranes are moist.  Oropharynx non-erythematous. Neck: No stridor.  Cardiovascular: Normal rate, regular rhythm. Good peripheral circulation. Grossly normal heart sounds. Respiratory: Normal respiratory effort.  No retractions. Lungs CTAB. Gastrointestinal: Soft and nontender. No distention.  Musculoskeletal: No  lower extremity tenderness nor edema. No gross deformities of extremities. Neurologic:  Normal speech and language. No gross focal neurologic deficits are appreciated.  Skin: Single abrasion noted to the right elbow with blanching erythema extending just proximal to the right wrist Psychiatric: Mood and affect are normal. Speech and behavior are normal.  ____________________________________________   LABS (all labs ordered are listed, but only abnormal results are displayed)  Labs Reviewed  COMPREHENSIVE METABOLIC PANEL - Abnormal; Notable for the following components:      Result Value   Glucose, Bld 122 (*)    Total Protein 8.3 (*)    Alkaline Phosphatase 37 (*)    All other components within normal limits  CBC WITH DIFFERENTIAL/PLATELET     Procedures   ____________________________________________   INITIAL IMPRESSION / ASSESSMENT AND PLAN / ED COURSE  As part of my medical decision making, I reviewed the following data within the electronic MEDICAL RECORD [id number] year old male presented with above-stated history and physical exam consistent with acute right arm/forearm cellulitis with failed outpatient management using clindamycin.  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  Patient will be given IV Bactrim in the emergency department with prescription for Bactrim DS 2 tablets twice daily times 10 days.  Spoke with patient at length and stressed the importance that he needs to present to the emergency department if area of erythema was to worsen worsening pain or fever. ____________________________________________  FINAL CLINICAL IMPRESSION(S) / ED DIAGNOSES  Final diagnoses:  Cellulitis of right upper extremity     MEDICATIONS GIVEN DURING THIS VISIT:  Medications  sulfamethoxazole-trimethoprim (BACTRIM) 320 mg in dextrose 5 % 500 mL IVPB (not administered)       ED Discharge Orders    None       Note:  This document was prepared using Dragon voice recognition software and may include unintentional dictation errors.    Darci CurrentBrown, [place] N, MD 10/10/17 [phone]

## 2017-10-10 LAB — AEROBIC CULTURE W GRAM STAIN (SUPERFICIAL SPECIMEN)

## 2017-10-10 LAB — AEROBIC CULTURE  (SUPERFICIAL SPECIMEN): SPECIAL REQUESTS: NORMAL

## 2017-10-10 MED ORDER — SULFAMETHOXAZOLE-TRIMETHOPRIM 800-160 MG PO TABS: 2.0000 | ORAL_TABLET | Freq: Two times a day (BID) | ORAL | 0 refills | Status: AC

## 2017-10-10 NOTE — ED Notes (Signed)
Matt in pharmacy called, awaiting IV abx

## 2019-07-11 IMAGING — DX DG ELBOW COMPLETE 3+V*R*
4 series · 4 of 4 positions shown · non-contrast
Comparison: None.

CLINICAL DATA: Right elbow pain after fall 2 days ago.

EXAM:
RIGHT ELBOW - COMPLETE 3+ VIEW

[elbow ap]
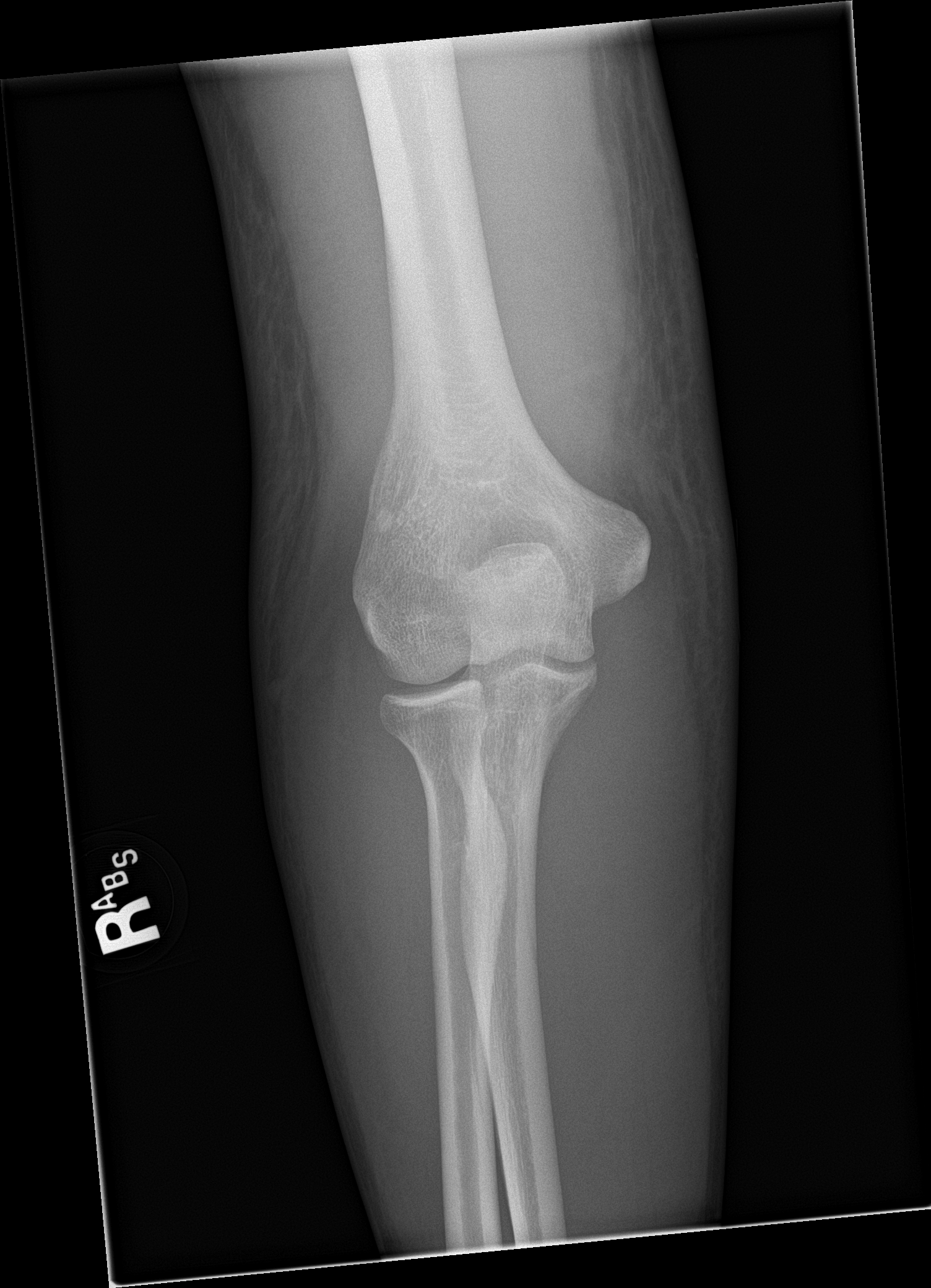

[elbow obl (1 of 2)]
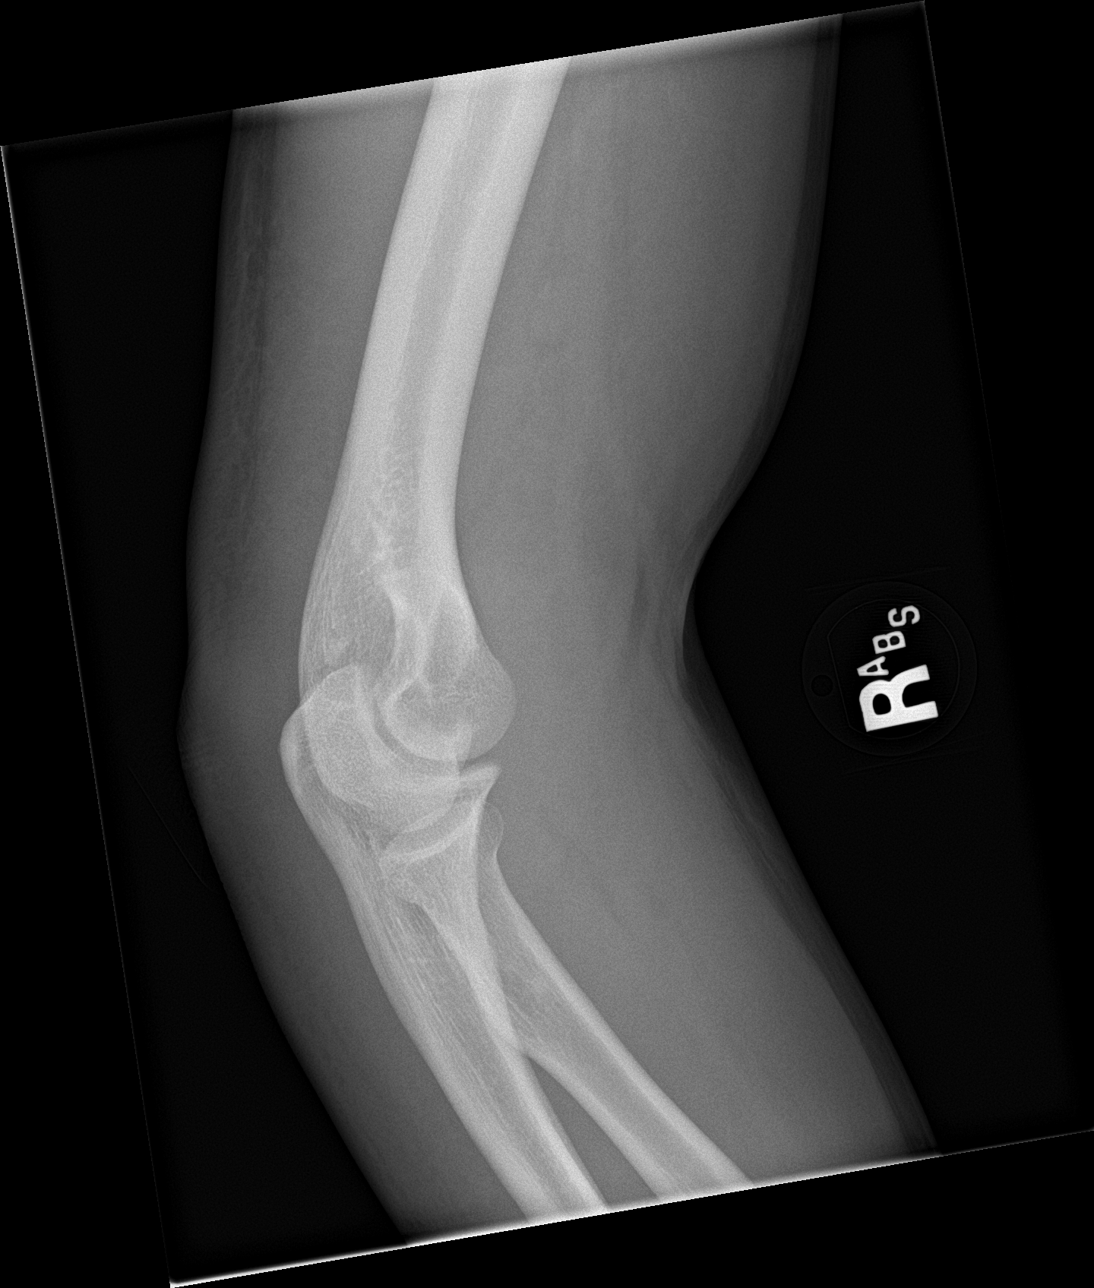

[elbow obl (2 of 2)]
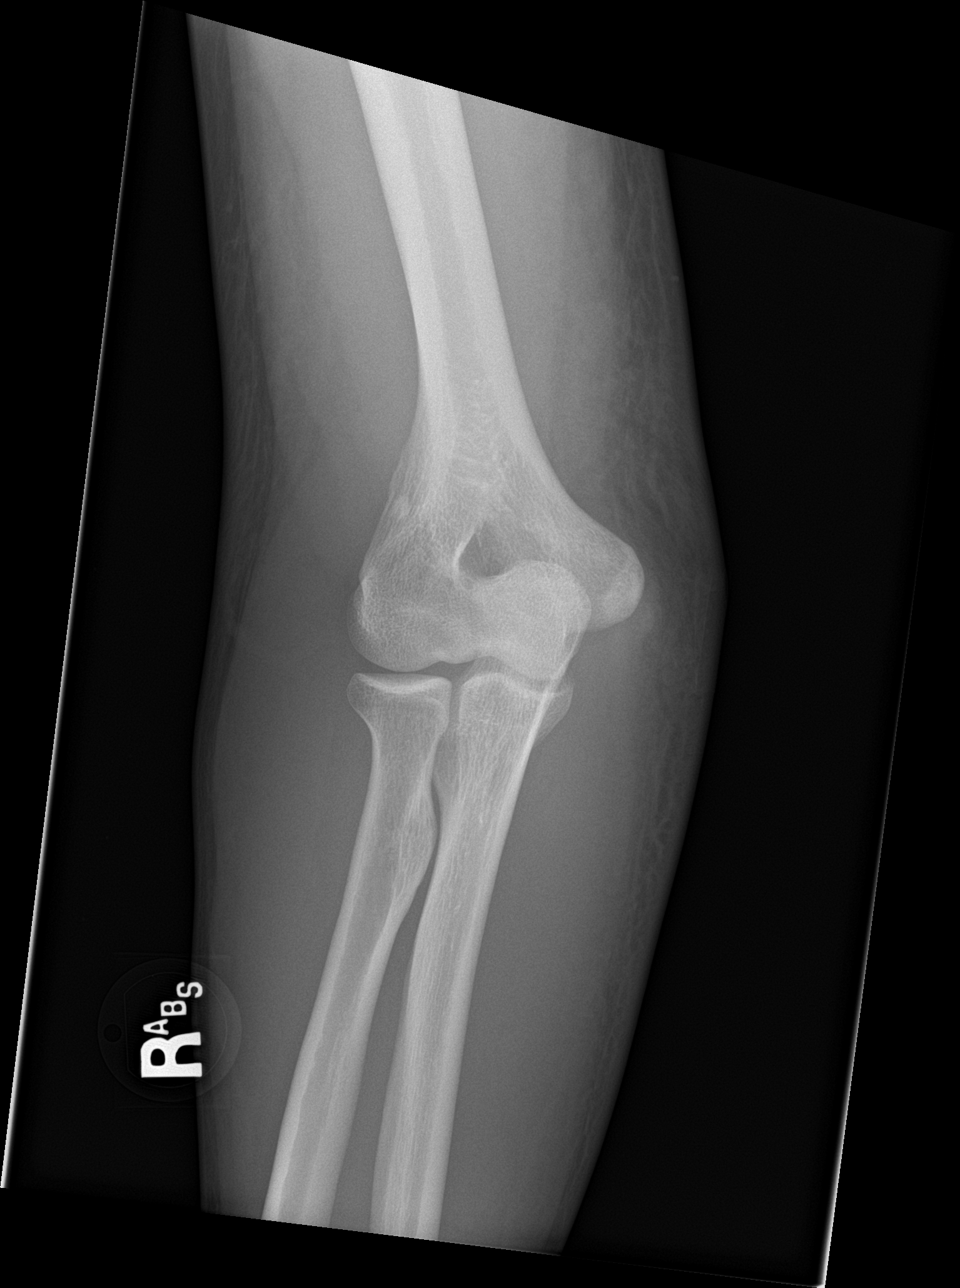

[elbow lat]
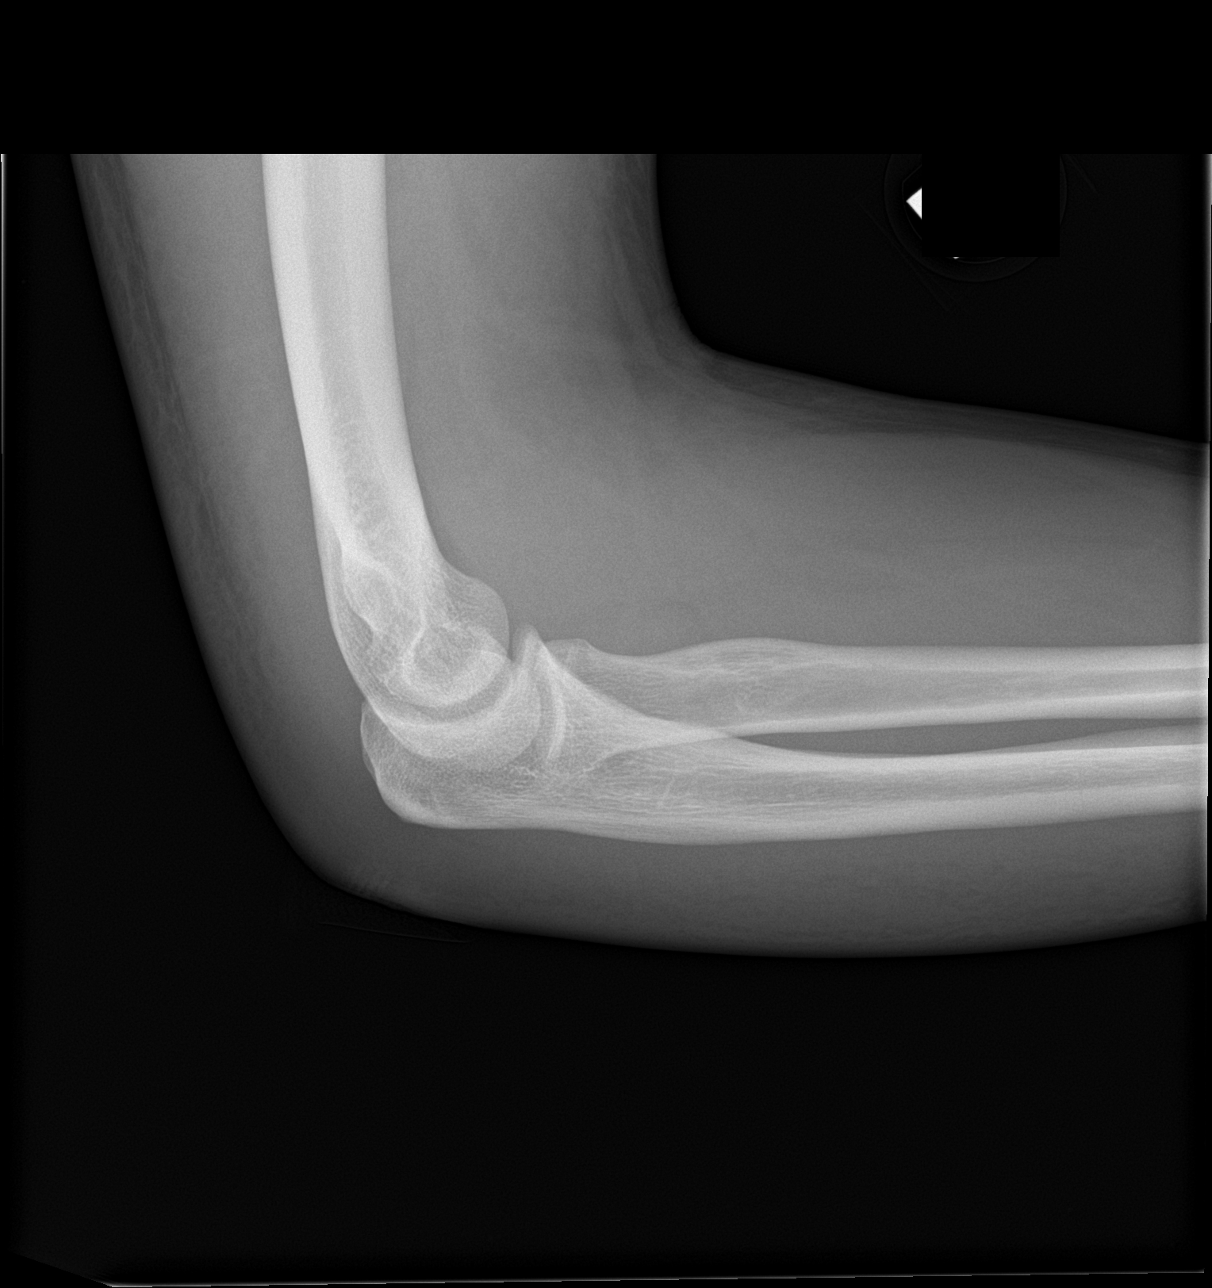

[4 of 4 positions shown; findings below may reference images not displayed]

FINDINGS: There is no evidence of fracture, dislocation, or joint effusion.
There is no evidence of arthropathy or other focal bone abnormality.
Mild soft tissue swelling over the olecranon.
IMPRESSION: Mild soft tissue swelling over the olecranon. No acute osseous
abnormality.

## 2022-10-20 ENCOUNTER — Ambulatory Visit: Payer: Commercial Managed Care - HMO | Attending: Student in an Organized Health Care Education/Training Program

## 2022-10-20 DIAGNOSIS — S0181XA Laceration without foreign body of other part of head, initial encounter: Secondary | ICD-10-CM

## 2022-10-20 MED ORDER — CEPHALEXIN 500 MG PO CAPS
500 mg | ORAL_CAPSULE | Freq: Four times a day (QID) | ORAL | 0 refills | Status: AC
Start: 2022-10-20 — End: ?

## 2022-10-20 MED ORDER — LEVOFLOXACIN 750 MG PO TABS
750 mg | ORAL_TABLET | Freq: Every day | ORAL | 0 refills | Status: AC
Start: 2022-10-20 — End: ?

## 2022-10-20 MED ORDER — DOXYCYCLINE HYCLATE 100 MG PO TABS
100 mg | ORAL_TABLET | Freq: Two times a day (BID) | ORAL | 0 refills | Status: AC
Start: 2022-10-20 — End: ?

## 2022-10-20 NOTE — Progress Notes
Chief Complaint   Patient presents with    Facial Laceration     Under chin xs 1hr       SUBJECTIVE:  Scott Sheppard is a 27 y.o. male who presents for   Chief Complaint   Patient presents with    Facial Laceration     Under chin xs 51hr     27 year old male presenting with laceration on her chin while surfing.  Minimal pain currently, advised by lifeguard to see if he needs antibiotics as water was dirty.       History reviewed. No pertinent past medical history.    There is no problem list on file for this patient.        Current Outpatient Medications:     cephalexin 500 mg capsule, Take 1 capsule (500 mg total) by mouth four (4) times daily for 3 days., Disp: 12 capsule, Rfl: 0    doxycycline hyclate 100 mg tablet, Take 1 tablet (100 mg total) by mouth two (2) times daily for 3 days., Disp: 6 tablet, Rfl: 0    levoFLOXacin 750 mg tablet, Take 1 tablet (750 mg total) by mouth daily for 3 days., Disp: 3 tablet, Rfl: 0    OBJECTIVE:  Vitals: BP 104/61  ~ Pulse 84  ~ Temp 36.4 ?C (97.5 ?F) (Tympanic)  ~ Resp 12  ~ SpO2 98%     General: NAD, pleasant & cooperative  Head: EOMI, no facial lesions, superficial 2.5 cm laceration under chin, no erythema, no discharge, no bleeding  Chest: no tachypnea, retractions or cyanosis.   CVS: no JVD, no peripheral edema.  Abdomen: soft, non-distended  Neuro: Normal sensation, no motor deficits  Skin: No ulcers, erythema, or skin breakdown  Psych: Alert, appropriate affect    Labs Reviewed:  No results found for this or any previous visit.      ASSESSMENT/PLAN:  1. Chin laceration, initial encounter  -laceration not deep enough to require stitches, will heal through secondary intention  -wound care instructions given  - cephalexin 500 mg capsule; Take 1 capsule (500 mg total) by mouth four (4) times daily for 3 days.  Dispense: 12 capsule; Refill: 0  - doxycycline hyclate 100 mg tablet; Take 1 tablet (100 mg total) by mouth two (2) times daily for 3 days.  Dispense: 6 tablet; Refill: 0  - levoFLOXacin 750 mg tablet; Take 1 tablet (750 mg total) by mouth daily for 3 days.  Dispense: 3 tablet; Refill: 0  - Tdap vaccine > or = to 26yo IM  -due to contaminated water exposure, we will treat with appropriate antibiotics  - OTC pain meds as needed  - low concern for severe soft tissue infection or abscess as minimal pain, fever, areas of fluctuance  - strict return precautions given for but not limited to fever, chills, rapidly spreading erythema, discharge, areas of fluctuance, worsening pain      ER and return precautions discussed:  If showing any of these signs, seek emergency medical care immediately  - Trouble breathing  - Persistent pain or pressure in the chest  - Pain out of proportion  - New confusion  - Inability to wake or stay awake  - Pale, gray, or blue-colored skin, lips, or nail beds, depending on skin tone  This list is not all possible symptoms. Please contact your doctor for any other symptoms that are severe or concerning to you.    The above plan of care, diagnosis, orders, and follow-up were discussed with  the patient/caregiver. All questions related to this recommended plan of care were answered.    Portions of this note may have been created with voice recognition software. Occasional wrong-word or ''sound-alike'' substitutions may have occurred due to the inherent limitations of voice recognition software. Please read the chart carefully and recognize, using context, where these substitutions have occurred.    There are no Patient Instructions on file for this visit.      Julien Nordmann MD  [facility]  10/20/2022

## 2023-02-11 ENCOUNTER — Ambulatory Visit: Payer: PRIVATE HEALTH INSURANCE

## 2023-02-11 DIAGNOSIS — J029 Acute pharyngitis, unspecified: Secondary | ICD-10-CM

## 2023-02-11 DIAGNOSIS — K146 Glossodynia: Secondary | ICD-10-CM

## 2023-02-11 DIAGNOSIS — K130 Diseases of lips: Secondary | ICD-10-CM

## 2023-02-11 MED ORDER — VALACYCLOVIR HCL 1 G PO TABS
2000 mg | ORAL_TABLET | Freq: Two times a day (BID) | ORAL | 0 refills | Status: AC
Start: 2023-02-11 — End: 2023-02-12

## 2023-02-11 MED ORDER — VALACYCLOVIR HCL 1 G PO TABS
2000 mg | ORAL_TABLET | Freq: Two times a day (BID) | ORAL | 0 refills | Status: AC
Start: 2023-02-11 — End: ?

## 2023-02-11 NOTE — Progress Notes
PATIENT: Scott Sheppard  MRN: [id number]  DOB: [birth year]  DATE OF SERVICE: 02/11/2023    CHIEF COMPLAINT:   Chief Complaint   Patient presents with    Mouth Lesions    Sore Throat    Fever       Subjective:      Scott Sheppard is a 27 y.o. male.    Patient presents c/o feeling run down  Sore throat  Light fever for few days  Felt like common cold  Getting cold sores around mouth  No prodromal symptoms for them   Has been surfing a lot   Thought was sun blisters  Last surfed last Monday and Friday    Home covid today negative    PCP: [facility], Unassigned Closed -, MD   PCP Telephone: None    No past medical history on file.    No current outpatient medications on file.     No current facility-administered medications for this visit.       No Known Allergies    Immunization History   Administered Date(s) Administered    Tdap 10/20/2022     Review of Systems  14 point ROS obtained and negative except for what is stated above.       Objective:      Physical Exam  Last Recorded Vital Signs:    02/11/23 1102   BP: 141/70   Pulse: 75   Resp: 16   Temp: 36.6 ?C (97.9 ?F)   SpO2: 98%     Gen:    A&O x 3 WDWN NAD, afebrile      Non-toxic and well appearing                 No respiratory distress, pox 98%  Skin:    Normal, no rashes.   NECK:    Supple  HEENT:  NCAT       EOMI, conj clear       Ear canals normal                 TMs normal       Nose wnl       Pharynx normal       Uvula midline                  Lips with sore upper and lower-> currently scabbed, ? Cold sore                  Tongue with what appears to be a canker sore                   (see photo documentation)  CV:     RRR, no murmurs, gallops, or rubs  Pulm:     CTA B, no wheezes, rhonchi, or rales       No labored breathing  Abd:        Soft, nontender  Neuro:    Non-focal exam                Orders:  HSV swab lip lesions: pending                 Rapid strep:  neg                 Throat cx:  pending    MDM:  Patient feeling run down, with sores on lips and tongue, has sore throat and low grade fever.  Differential includes, but is not limited to, herpes  cold sore, viral infection / stomatitis, canker sore, pharyngitis (viral vs bacterial).    He is well appearing.  Lesion swabbed and sent for HSV.  Rapid strep negative  Throat cx pending  Will cover with valtrex.          Assessment:       Sore Throat  Stomatitis  Lip Lesions, ? Cold sore       Plan:       Orders Placed This Encounter    HSV Type 1 and 2 by PCR Group, Lesion    Bacterial Culture Throat    POCT rapid strep A    valACYclovir 1000 mg tablet       Supportive care  Rest  Fluids, stay hydrated  Fever control  Tylenol/Ibuprofen  Throat lozenges  Saline gargles  Eat healthy, no alcohol and stay away from cigarettes, or second hand smoke  Patient is clinically stable for discharge  Follow up with pcp  RTC precautions  ER if condition worsens or new symptoms develop    Medication indications and adverse reactions discussed w/patient.     SEEK EMERGENCY MEDICAL CARE IF YOU ARE SERIOUSLY SICK WITH:  -seem to be getting sicker or worse  -difficulty breathing  -chest pain  -can't keep fluids down, dehydration  -nausea, vomiting or diarrhea  -not acting right, confused, altered mentation   -new or higher fever  -develop a new rash  -if sore throat and you develop drooling, difficulty breathing  -bluish lips  -you do not get better as expected    This list is not all possible symptoms.  Please contact your doctor for any other symptoms that are severe or concerning to you.    TAKE PRECAUTIONS TO REDUCE RISK OF SPREADING URI / INFECTION:  -hand hygiene  -cover your mouth when coughing or sneezing  -avoid sharing items like cups, lip balms, etc.    I discussed the above assessment and plan with the patient.  Patient had the opportunity to ask questions, and questions and concerns were address              Author:  Bard Herbert 02/11/2023 11:28 AM

## 2023-02-12 LAB — HSV Type 1 and 2 by PCR

## 2023-02-12 LAB — Bacterial Culture Throat

## 2023-02-12 NOTE — Patient Instructions
Possible Cold Sore (Adult)   A cold sore (fever blister) is a small sore or blister on the lip. Sometimes it's inside the mouth. Most cold sores are caused by herpes simplex virus type 1 (HSV-1). The virus spreads easily from person to person. It can enter the body through a break in the skin, such as a cut or scrape. It can also enter through mucous membranes such as the lips or mouth. Many people have the virus in their body. They are often first exposed to the HSV-1 in childhood. But not everyone who has HSV will get a cold sore. In some cases, cold sores can be caused through sexual contact by HSV type 2 (HSV-2). HSV-2 is the more common cause of genital herpes. But it can sometimes cause sores on the lips or in the mouth.   A cold sore starts as 1 or more painful blisters on the lip or in the mouth. The blisters break open and crust. They often go away in a week. The first time you have a cold sore, the symptoms are often more severe. You may have a fever as well as mouth and throat pain. After the cold sore goes away, it can come back in the same spot. This is because the virus stays in the body. But after the first outbreak, other symptoms such as fever are often mild. Or they don?t come back.   Home care  If you have been prescribed antiviral medicines, take these as your healthcare provider directs. You may use over-the-counter medicine as directed.  Putting petroleum jelly on a sore using a disposable cotton swab may help ease pain. Ask your provider before using any other creams or ointments.  For severe pain, wrap an ice cube in a cloth. Apply it to the sore for a few minutes at a time. Or rinse your mouth with a glass of warm water mixed with a teaspoon of baking soda to ease pain.  Don't eat acidic foods such as citrus fruits and tomatoes.  Don?t touch the cold sore. The virus can cause a sore on the finger. It?s very important not to touch the sore and then touch your eyes. The virus can spread to the eyes.  Don't kiss other people.  Don't share eating utensils, lip balm, razors, or towels.  Wash your hands after touching the area of a cold sore. The herpes virus can be carried from your face to your hands when you touch the area of a cold sore. When this happens, wash your hands well, for at least 20 seconds. When you can?t wash with soap and water, use an alcohol-based hand sanitizer.  Disinfect things you touch often, such as phones and keyboards.  Use condoms to help prevent passing on the viruses through sex.  Don?t have oral sex.    Follow-up care  Follow up with your healthcare provider, or as advised.  When to call your healthcare provider   Call your healthcare provider right away if you have any of these:  Fever of 100.4?F (38?C) or higher, or as directed by your provider  Pain that gets worse  You can?t eat or drink because of painful sores  Blisters spread beyond your mouth or lip to parts of the chest, arms, face (especially the eyes), or legs  New symptoms  StayWell last reviewed this educational content on 07/18/2021  ? 2000-2023 [employer], [place]. All rights reserved. This information is not intended as a substitute for professional medical  care. Always follow your healthcare professional's instructions.        When You Have a Sore Throat  A sore throat can be painful. There are many reasons why you may have a sore throat. Your healthcare provider will work with you to find the cause of your sore throat. They'll also find the best treatment for you.     What causes a sore throat?  Sore throats can be caused or made worse by:   Cold or flu viruses  Bacteria  Irritants such as tobacco smoke or air pollution  Acid reflux  A healthy throat  The tonsils are on the sides of the throat near the base of the tongue. They are part of the immune system and collect viruses and bacteria and help fight infection. The throat (pharynx) is the passage for air. Mucus from the nasal cavity also moves down the passage.   An inflamed throat  The tonsils and pharynx can become inflamed due to a cold or flu virus. Postnasal drip (excess mucus draining from the nasal cavity) can irritate the throat. It can also make the throat or tonsils more likely to be infected by bacteria. Severe, untreated tonsillitis in children or adults can cause a pocket of pus (abscess) to form near the tonsil.   Your evaluation  A health evaluation can help find the cause of your sore throat. It can also help your healthcare provider choose the best treatment for you. The evaluation may include a health history, physical exam, and provider may ask you:   How long has the sore throat lasted and how have you been treating it?  Do you have any other symptoms, such as body aches, fever, or cough?  Does your sore throat keep coming back (recur)? If so, how often? How many days of school or work have you missed because of a sore throat?  Do you have trouble eating or swallowing?  Have you been told that you snore or have other sleep problems?  Do you have bad breath?  Do you cough up bad-tasting mucus?  Physical exam  During the exam, your healthcare provider checks your ears, nose, and throat for problems. They also check for swelling in the neck, and may listen to your chest.   Possible tests  Other tests you may have include:   A throat swab to check for bacteria such as streptococcus (the bacteria that causes strep throat)  A blood test to check for certain infections. These may include mononucleosis (a viral infection).  A chest X-ray to rule out pneumonia, especially if you have a cough  Treating a sore throat  Treatment depends on many factors. What's the likely cause? Is the problem recent? Does it keep coming back? In many cases, the best thing to do is to treat the symptoms, rest, and let the problem heal itself. Antibiotics may help clear up some bacterial infections. For cases of severe or recurring tonsillitis, the tonsils may need to be removed.   Easing your symptoms  Don?t smoke, and stay away from secondhand smoke.  For children, try throat sprays or frozen ice pops. Adults and older children may try lozenges.  Drink warm liquids to soothe the throat and help thin mucus. Stay away from alcohol, spicy foods, and acidic drinks such as Garnett juice. These can irritate the throat.  Gargle with warm saltwater ( 1 teaspoon of salt to  8 ounces of warm water).  Use a humidifier to keep air moist  and ease throat dryness.  Try over-the-counter pain relievers such as acetaminophen or ibuprofen. Use as directed, and don?t take more than the advised dose. Don?t give aspirin to a child younger than age 63 unless directed by the child?s provider. Taking aspirin can put your child at risk for Reye syndrome. This is a rare but very serious disorder. It most often affects the brain and the liver.    Are antibiotics needed?  If your sore throat is due to a bacterial infection, antibiotics may speed healing and prevent complications. Strep throat (group A streptococcus) is the major treatable infection for a sore throat. But strep throat causes only 5% to 15% of sore throats in adults who get medical care. Most sore throats are caused by cold or flu viruses. And antibiotics don?t treat viral illness. In fact, using antibiotics when they?re not needed may lead to bacteria that are harder to kill. Your healthcare provider will prescribe antibiotics only if they think they're likely to help.   If antibiotics are prescribed  Take the medicine exactly as directed. Be sure to finish your prescription even if you?re feeling better. Ask your healthcare provider or pharmacist what side effects are common and what to do about them.   Is surgery needed?  In some cases, tonsils need to be removed. This is often done as same-day (outpatient) surgery. Your healthcare provider may advise removing the tonsils in cases of:   Several severe bouts of tonsillitis in a year. Severe episodes include those that lead to missed days of school or work, or that need to be treated with antibiotics.  Tonsillitis that causes breathing problems during sleep  Tonsillitis caused by food particles collecting in pouches in the tonsils (cryptic tonsillitis)  When to call your healthcare provider   Call your healthcare provider right away if any of these occur:  Problems swallowing  Symptoms get worse, or you have new symptoms  Swollen tonsils make it hard to breathe  The pain is severe enough to keep you from drinking liquids  You have a skin rash or hives, which could be an allergic reaction to antibiotics  Symptoms don?t get better within a week  Symptoms don?t get better within  2 to 3  days of starting antibiotics  Call 911  Call 911 right away if any of these occur:   Trouble breathing or problems catching your breath  Skin is blue, purple, or gray in color  Trouble talking  Feeling dizzy or faint  Feeling of doom  StayWell last reviewed this educational content on 06/17/2020  ? 2000-2023 [employer], [place]. All rights reserved. This information is not intended as a substitute for professional medical care. Always follow your healthcare professional's instructions.        Understanding Canker Sores  Canker sores (aphthous ulcers) are small, painful sores in the mouth. They occur most often on the tongue, gums, or insides of the cheeks.   What causes a canker sore?  The exact cause of canker sores is not known. But they are linked to different conditions. These include:   An injury or irritation in the mouth, such as biting the inside of your cheek or braces rubbing  Allergy or sensitivity to certain foods or substances, such as citrus juice or some kinds of toothpaste  Poor nutrition  Emotional stress  Certain infections and illnesses  Canker sores often run in families. Children and teens tend to get canker sores more often than adults. Canker sores  aren't contagious.   What are the symptoms of a canker sore?  These are some common symptoms of canker sores:  Sores are open and grayish-yellow, surrounded by redness.  Sores are often painful and sensitive to touch.  You may have a burning or tingling feeling a few hours to a few days before the sore appears.  How are canker sores treated?  Canker sores often go away by themselves within 7 to 14 days. There is no cure for canker sores. Treatment focuses on easing symptoms and shortening outbreaks. Treatments may include:   Prescription or over-the-counter topical treatments.  These medicines are put right on the sores. Topical steroids may protect the canker sores from more irritation and allow them to heal. Topical pain relief medicines may numb the area and make the sores less painful.  Certain types of toothpaste. These don't contain sodium lauryl sulfate. This type of toothpaste may prevent further aggravation of canker sores.  Oral prescription medicines. Oral medicines are taken by mouth. These are used for severe cases to help ease symptoms.  Prescription or over-the-counter pain medicines.  These help with mild pain.  What are possible complications of a canker sore?   Mouth sores that seem to be canker sores can be signs of a more serious illness. If you have other signs of illness along with mouth sores, talk with a healthcare provider. Canker sores can be so painful that they cause problems with talking, eating, or drinking.   When should I call my healthcare provider?  Call your healthcare provider right away if you have any of these:  Canker sores that don?t go away after 2 weeks  Canker sores that come back more than 3 times a year  Canker sores that are larger than about a half-inch across  Fever of 100.4?F (38?C) or higher, or as directed by your healthcare provider  Pain that gets worse  You aren?t able to eat or drink because of painful sores  Symptoms that don?t get better, or symptoms that get worse  New symptoms  StayWell last reviewed this educational content on 03/17/2021  ? 2000-2023 [employer], [place]. All rights reserved. This information is not intended as a substitute for professional medical care. Always follow your healthcare professional's instructions.

## 2023-02-13 ENCOUNTER — Ambulatory Visit: Payer: PRIVATE HEALTH INSURANCE

## 2023-02-13 LAB — Bacterial Culture Throat

## 2023-02-14 MED ORDER — AMOXICILLIN 875 MG PO TABS
875 mg | ORAL_TABLET | Freq: Two times a day (BID) | ORAL | 0 refills | Status: AC
Start: 2023-02-14 — End: ?

## 2024-04-13 ENCOUNTER — Ambulatory Visit: Payer: Commercial Managed Care - HMO

## 2024-04-13 DIAGNOSIS — J029 Acute pharyngitis, unspecified: Principal | ICD-10-CM

## 2024-04-13 MED ORDER — AMOXICILLIN 875 MG PO TABS
875 mg | ORAL_TABLET | Freq: Two times a day (BID) | ORAL | 0 refills | 6.00000 days | Status: AC
Start: 2024-04-13 — End: ?

## 2024-04-13 NOTE — Progress Notes
 IMMEDIATE CARE VISIT     Health     Date of service: 04/13/2024    Patient: Scott Sheppard  Date of birth: 02-06-1996   MRN: [id number]  PCP: [facility], Unassigned Closed -, MD       SUBJECTIVE:     Chief Complaint   Patient presents with    Sore Throat     5 days         History of Present Illness    Scott Sheppard, 28 year old male  - Sore throat for 5 days, pain rated 3-4/10, persistent, not improving  - No other symptoms reported except mild drowsiness and fatigue on first day, resolved  - No fever during this period  - Similar episode in May 2024, throat culture with Group G strep,   - Surfing a lot  - Using salt water gargles 3-4 times daily and zinc lozenges for symptom relief  - No history of mononucleosis   - Upcoming charity bike ride this weekend.   Wants to feel well.        No past medical history on file.  No past surgical history on file.  Outpatient Medications Prior to Visit   Medication Sig Dispense Refill    amoxicillin  875 mg tablet Take 1 tablet (875 mg total) by mouth two (2) times daily. 20 tablet 0    valACYclovir  1000 mg tablet Take 2 tablets (2,000 mg total) by mouth two (2) times daily For a total of 2 doses. 4 tablet 0     No facility-administered medications prior to visit.     No Known Allergies  No family history on file.  Social History     Socioeconomic History    Marital status: Unknown   Tobacco Use    Smoking status: Unknown       OBJECTIVE:   BP 104/63  ~ Pulse 57  ~ Temp 36.8 ?C (98.3 ?F) (Oral)  ~ Resp 16  ~ SpO2 98%  There is no height or weight on file to calculate BMI.    Physical Exam  Vitals and nursing note reviewed.   Constitutional:       Appearance: He is not ill-appearing.   HENT:      Head: Normocephalic and atraumatic.      Right Ear: Tympanic membrane normal.      Left Ear: Tympanic membrane normal.      Mouth/Throat:      Mouth: Mucous membranes are moist.      Pharynx: Uvula midline. Posterior oropharyngeal erythema present. No uvula swelling.      Tonsils: Tonsillar exudate (scant) present.   Eyes:      Extraocular Movements: Extraocular movements intact.      Conjunctiva/sclera: Conjunctivae normal.      Pupils: Pupils are equal, round, and reactive to light.   Cardiovascular:      Rate and Rhythm: Normal rate and regular rhythm.   Pulmonary:      Effort: Pulmonary effort is normal. No respiratory distress.      Breath sounds: Normal breath sounds. No wheezing.   Musculoskeletal:      Cervical back: Neck supple.   Lymphadenopathy:      Cervical: No cervical adenopathy.   Skin:     General: Skin is warm and dry.   Neurological:      Mental Status: He is alert.         Lab Review:  Office Visit on 04/13/2024   Component Date Value Ref Range Status  Rapid Strep A Screen, Manual 04/13/2024 Negative  Negative Final       Prior pertinent visits, outside records, labs, and imaging reviewed.     ASSESSMENT & PLAN:       ICD-10-CM    1. Sore throat  J02.9 POCT rapid strep A     Bacterial Culture Throat     amoxicillin  875 mg tablet        Orders Placed This Encounter    Bacterial Culture Throat    POCT rapid strep A    amoxicillin  875 mg tablet     Sore throat x 5 days.  Rapid strep test negative.  Given history of Group G strep, he will be prescribed antibiotics to start today.  Throat culture pending.  Continue symptomatic treatment.  Return precautions.     The above plan of care, diagnosis, orders, and follow-up were discussed with the patient, who understands and agrees.    Questions related to this recommended plan of care were answered.  See AVS for additional information and counseling materials provided to the patient.      Rollo CANDIE Fee, MD 04/13/2024   Emergency physician    Ambient Scribe Use Consent: This note was generated using assisted transcription with the verbal consent of the patient/guardian. The note has been reviewed and edited accordingly by me.

## 2024-04-13 NOTE — Patient Instructions
Pharyngitis: Strep (Presumed)    You have pharyngitis (sore throat). The healthcare staff may think your sore throat is caused by a bacteria called Group A streptococcus (strep). This is often called strep throat. This is diagnosed by either a rapid strep test, which gives immediate results, or a throat culture, or both. Strep throat can cause throat pain that is worse when swallowing, aching all over, headache, swollen lymph nodes or glands in the neck, and fever. The infection is contagious. It often spreads by coughing, kissing, or touching others after touching your mouth or nose. An antibiotic is given to treat the infection. Antibiotics are prescribed by healthcare providers to treat bacterial infections, not viral infections.   Home care  Rest at home. Drink plenty of fluids so you won?t get dehydrated.  Stay home from work or school for the first 24 hours of taking the antibiotics, or as directed by the healthcare provider. After this time, you won't be contagious. You can then return to work or school if you are feeling better, or as directed by the provider.   Take the antibiotic medicine until it's gone, or as advised by the provider, even when you feel better. Or your symptoms improve. This is very important to make sure the infection is fully treated. It's also important to prevent medicine-resistant germs from growing. If you were given an antibiotic shot, your provider will tell you if more antibiotics are needed.  You may use acetaminophen or ibuprofen to control pain or fever, unless another medicine was prescribed for this. If you have chronic liver or kidney disease or ever had a stomach ulcer or gastrointestinal bleeding, talk with your provider before using these medicines.  Use throat lozenges or a throat-numbing spray to help reduce throat pain. Gargling with warm saltwater can also help reduce throat pain. Dissolve 1/2 teaspoon of salt in 1 glass of warm water.   Don?t eat salty or spicy foods. These can irritate the throat.    Follow-up care  Follow up with your healthcare provider or our staff if you don't feel better in 48 hours, or as directed.   When to get medical advice  Call your healthcare provider right away if any of these occur:   Fever of 100.4?F (38?C) or higher, or as advised by your provider  New or worse ear pain, sinus pain, or headache  Painful lumps in the back of neck  Stiff neck  Lymph nodes that get larger or neck swelling  Can?t open mouth wide due to throat pain  Signs of fluid loss (dehydration), such as very dark urine or no urine, sunken eyes, dizziness  Noisy breathing  Muffled voice  New rash  Symptoms are worse  New symptoms develop  Call 911  Call 911 if you have any of the following symptoms:   Can't swallow, especially saliva, with a lot of drooling  Trouble breathing or wheezing  Feeling faint  Can't talk  Feeling of doom  Prevention  Here are steps you can take to help prevent an infection:  Keep good handwashing habits.  Don?t have close contact with people who have sore throats, colds, or other upper respiratory infections.  Don?t smoke, and stay away from secondhand smoke.  Stay up to date with all of your vaccines.  StayWell last reviewed this educational content on 03/17/2021  ? 2000-2023 [employer], [place]. All rights reserved. This information is not intended as a substitute for professional medical care. Always follow your healthcare  professional's instructions.      Thank you for choosing [facility] today.     Call your doctor, return immediately to Immediate Care or go to the Emergency Room if you develop new or worsening problems.     Take medications as directed and follow-up with your doctor (or specialist) as advised.     If you had an x-Ray done, the x-ray will be reviewed later by a radiologist. You will be notified via [facility] if there are important findings. The final report from the radiologist will be available via [facility].     If you had any lab testing done today, the results will be available in the [facility] application.     COVID, influenza and RSV PCR should result in 24-48 hours.     Feel better soon!

## 2024-04-14 LAB — Bacterial Culture Throat: BACTERIAL CULTURE THROAT: NORMAL

## 2024-04-15 ENCOUNTER — Other Ambulatory Visit: Payer: PRIVATE HEALTH INSURANCE

## 2024-04-15 LAB — Bacterial Culture Throat: BACTERIAL CULTURE THROAT: NORMAL
